# Patient Record
Sex: Female | Born: 1965 | Race: White | Hispanic: No | Marital: Married | Smoking: Never smoker
Health system: Southern US, Community
[De-identification: ages and names within clinical notes are randomized; demographics above are authoritative.]

## PROBLEM LIST (undated history)

## (undated) DIAGNOSIS — E119 Type 2 diabetes mellitus without complications: Secondary | ICD-10-CM

## (undated) HISTORY — PX: ABDOMINAL HYSTERECTOMY: SHX81

## (undated) HISTORY — DX: Type 2 diabetes mellitus without complications: E11.9

---

## 2005-05-26 ENCOUNTER — Other Ambulatory Visit: Admission: RE | Admit: 2005-05-26 | Discharge: 2005-05-26 | Payer: Self-pay | Admitting: Obstetrics and Gynecology

## 2006-03-23 ENCOUNTER — Ambulatory Visit: Payer: Self-pay | Admitting: Unknown Physician Specialty

## 2006-11-04 ENCOUNTER — Ambulatory Visit: Payer: Self-pay | Admitting: Psychiatry

## 2006-11-06 ENCOUNTER — Ambulatory Visit: Payer: Self-pay | Admitting: Psychiatry

## 2006-11-09 ENCOUNTER — Ambulatory Visit: Payer: Self-pay | Admitting: Anesthesiology

## 2006-11-09 ENCOUNTER — Emergency Department: Payer: Self-pay | Admitting: Emergency Medicine

## 2007-02-01 ENCOUNTER — Ambulatory Visit: Payer: Self-pay | Admitting: Unknown Physician Specialty

## 2013-09-27 ENCOUNTER — Ambulatory Visit: Payer: Self-pay | Admitting: Specialist

## 2014-07-10 ENCOUNTER — Ambulatory Visit: Payer: Self-pay | Admitting: Nurse Practitioner

## 2014-07-13 ENCOUNTER — Ambulatory Visit: Payer: Self-pay | Admitting: Nurse Practitioner

## 2014-09-24 ENCOUNTER — Ambulatory Visit: Payer: Self-pay | Admitting: Nurse Practitioner

## 2015-01-03 NOTE — Op Note (Signed)
PATIENT NAME:  Erica Ward, Erica Ward MR#:  [id number] DATE OF BIRTH:  November 16, 1965  DATE OF PROCEDURE:  09/27/2013  PREOPERATIVE DIAGNOSIS: Bilateral carpal tunnel syndrome.   POSTOPERATIVE DIAGNOSIS: Bilateral carpal tunnel syndrome.   PROCEDURES:  1.  Right carpal tunnel release.  2.  Left carpal tunnel release.   SURGEON: Myra Rudehristopher Haruki Arnold, M.D.   ANESTHESIA: General.   COMPLICATIONS: None.   TOURNIQUET TIME: Approximately 15 minutes on each side.   PROCEDURE: After adequate induction of general anesthesia, the upper extremities are both prepped with alcohol and DuraPrep and draped in standard sterile fashion. Identical procedures are performed on each side. The extremity is wrapped out with the Esmarch bandage and pneumatic tourniquet elevated to 250 mmHg. Under loupe magnification, a standard small volar carpal tunnel incision is made and the dissection is carefully carried down to the transverse retinacular ligament. This is incised in the midportion with the knife. The distal release is performed with the small scissors. The proximal release is performed with the small scissors and the carpal tunnel scissors. On each side, there is seen to be moderate compression of the nerve directly beneath the ligament. Careful check is made both proximally and distally to ensure that complete release has been obtained. The wound is thoroughly irrigated multiple times. Skin edges are infiltrated with 0.5% plain Marcaine. The skin is closed with 5-0 nylon. A soft bulky dressing is applied. The tourniquet is released. The patient is returned to the recovery room in satisfactory condition having tolerated the procedure quite well.   ____________________________ Clare Gandyhristopher E. Shateria Paternostro, MD ces:aw D: 09/27/2013 09:34:04 ET T: 09/27/2013 09:44:34 ET JOB#: [id number]  cc: Clare Gandyhristopher E. Braylon Lemmons, MD, <Dictator> Clare GandyHRISTOPHER E Mia Milan MD ELECTRONICALLY SIGNED 10/03/2013 20:23

## 2016-04-13 ENCOUNTER — Encounter: Payer: BC Managed Care – PPO | Attending: Family Medicine | Admitting: *Deleted

## 2016-04-13 ENCOUNTER — Encounter: Payer: Self-pay | Admitting: *Deleted

## 2016-04-13 VITALS — BP 120/82 | Ht 64.0 in | Wt 197.4 lb

## 2016-04-13 DIAGNOSIS — Z6833 Body mass index (BMI) 33.0-33.9, adult: Secondary | ICD-10-CM | POA: Diagnosis not present

## 2016-04-13 DIAGNOSIS — E119 Type 2 diabetes mellitus without complications: Secondary | ICD-10-CM | POA: Diagnosis not present

## 2016-04-13 DIAGNOSIS — E1165 Type 2 diabetes mellitus with hyperglycemia: Secondary | ICD-10-CM

## 2016-04-13 DIAGNOSIS — Z713 Dietary counseling and surveillance: Secondary | ICD-10-CM | POA: Insufficient documentation

## 2016-04-13 NOTE — Progress Notes (Signed)
Diabetes Self-Management Education  Visit Type: First/Initial  Appt. Start Time: 0840 Appt. End Time: 1000  04/13/2016  Ms. Yi Minniear, identified by name and date of birth, is a 50 y.o. female with a diagnosis of Diabetes: Type 2.   ASSESSMENT  Blood pressure 120/82, height  (1.626 m), weight 197 lb 6.4 oz (89.5 kg). Body mass index is 33.88 kg/m.      Diabetes Self-Management Education - 04/13/16 1144      Visit Information   Visit Type First/Initial     Initial Visit   Diabetes Type Type 2   Are you currently following a meal plan? Yes   What type of meal plan do you follow? "watching labels"   Are you taking your medications as prescribed? Yes   Date Diagnosed 1 month     Health Coping   How would you rate your overall health? Good     Psychosocial Assessment   Patient Belief/Attitude about Diabetes Other (comment)  "embarrased"   Self-care barriers None   Self-management support Doctor's office;Friends   Patient Concerns Nutrition/Meal planning;Glycemic Control;Monitoring;Weight Control   Special Needs None   Preferred Learning Style Auditory;Visual   Learning Readiness Contemplating   How often do you need to have someone help you when you read instructions, pamphlets, or other written materials from your doctor or pharmacy? 1 - Never   What is the last grade level you completed in school? 12th     Pre-Education Assessment   Patient understands the diabetes disease and treatment process. Needs Instruction   Patient understands incorporating nutritional management into lifestyle. Needs Instruction   Patient undertands incorporating physical activity into lifestyle. Needs Instruction   Patient understands using medications safely. Needs Instruction   Patient understands monitoring blood glucose, interpreting and using results Needs Instruction   Patient understands prevention, detection, and treatment of acute complications. Needs Instruction   Patient  understands prevention, detection, and treatment of chronic complications. Needs Instruction   Patient understands how to develop strategies to address psychosocial issues. Needs Instruction   Patient understands how to develop strategies to promote health/change behavior. Needs Instruction     Complications   Last HgB A1C per patient/outside source 11.6 %  02/18/16   How often do you check your blood sugar? 0 times/day (not testing)  Provided One Touch Verio Flex meter and instructed on use. BG upon return demonstration was 385 mg/dL at 1:61 am - 2 hrs pp.    Have you had a dilated eye exam in the past 12 months? Yes   Have you had a dental exam in the past 12 months? Yes   Are you checking your feet? No     Dietary Intake   Breakfast milk, oatmeal or poptarts   Lunch peanut butter sandwich or crackers   Snack (afternoon) almonds or dry cereal   Dinner BBQ or [employer] or chicken wtih peas, corn, green beans   Beverage(s) water, fruit juice blends, sugar sweetened tea     Patient Education   Previous Diabetes Education No   Disease state  Definition of diabetes, type 1 and 2, and the diagnosis of diabetes   Nutrition management  Role of diet in the treatment of diabetes and the relationship between the three main macronutrients and blood glucose level;Food label reading, portion sizes and measuring food.   Physical activity and exercise  Role of exercise on diabetes management, blood pressure control and cardiac health.   Medications Reviewed patients medication for diabetes, action,  purpose, timing of dose and side effects.   Monitoring Taught/evaluated SMBG meter.;Purpose and frequency of SMBG.;Identified appropriate SMBG and/or A1C goals.   Chronic complications Relationship between chronic complications and blood glucose control   Psychosocial adjustment Identified and addressed patients feelings and concerns about diabetes     Individualized Goals (developed by patient)   Reducing  Risk Improve blood sugars Prevent diabetes complications Lose weight     Outcomes   Expected Outcomes Demonstrated interest in learning. Expect positive outcomes   Future DMSE 4-6 wks      Individualized Plan for Diabetes Self-Management Training:   Learning Objective:  Patient will have a greater understanding of diabetes self-management. Patient education plan is to attend individual and/or group sessions per assessed needs and concerns.   Plan:   Patient Instructions  Check blood sugars 2 x day before breakfast and 2 hrs after supper every day Exercise: Begin walking  for 15  minutes 3  days a week and gradually increase to 150 minutes/week;  Eat 3 meals day,  1-2 snacks a day Space meals 4-6 hours apart Avoid sugar sweetened drinks (soda, tea, juices) Drink plenty of water Bring blood sugar records to the next class Call your doctor for a prescription for:  1. Meter strips (type) One Touch Verio  checking  2      times per day  2. Lancets (type) One Touch Delica checking  2  times per day   Expected Outcomes:  Demonstrated interest in learning. Expect positive outcomes  Education material provided:  General Meal Planning Guidelines Simple Meal Plan Meter - One Touch Verio Flex  If problems or questions, patient to contact team via:  Sharion Settler, RN, CCM, CDE [phone]  Future DSME appointment: 4-6 wks  Monday May 09, 2016 for Class 1

## 2016-04-13 NOTE — Patient Instructions (Signed)
Check blood sugars 2 x day before breakfast and 2 hrs after supper every day  Exercise: Begin walking  for 15  minutes 3  days a week and gradually increase to 150 minutes/week;   Eat 3 meals day,  1-2 snacks a day Space meals 4-6 hours apart Avoid sugar sweetened drinks (soda, tea, juices) Drinks plenty of water  Bring blood sugar records to the next class  Call your doctor for a prescription for:  1. Meter strips (type) One Touch Verio  checking  2      times per day  2. Lancets (type) One Touch Delica checking  2  times per day  Return for classes on:

## 2016-04-15 ENCOUNTER — Telehealth: Payer: Self-pay | Admitting: *Deleted

## 2016-04-15 NOTE — Telephone Encounter (Signed)
Called patient at 1:30 pm and left voice mail to call back with BG readings. She left me a voice mail with her numbers. Faxed those to Dr. Maryjane Hurter.

## 2016-04-21 ENCOUNTER — Telehealth: Payer: Self-pay | Admitting: *Deleted

## 2016-04-21 NOTE — Telephone Encounter (Signed)
Phone call to follow up with patient regarding blood sugars. [place] out [place] and is returning home today. She reports that MD office didn't change medication but called to schedule an appointment for tomorrow. Patient reports FBG's still 200's mg/dL and post meal readings 300's-400's mg/dL.

## 2016-05-09 ENCOUNTER — Encounter: Payer: Self-pay | Admitting: Dietician

## 2016-05-09 ENCOUNTER — Encounter: Payer: BC Managed Care – PPO | Admitting: Dietician

## 2016-05-09 VITALS — Ht 64.0 in | Wt 198.7 lb

## 2016-05-09 DIAGNOSIS — E1165 Type 2 diabetes mellitus with hyperglycemia: Secondary | ICD-10-CM

## 2016-05-09 DIAGNOSIS — Z713 Dietary counseling and surveillance: Secondary | ICD-10-CM | POA: Diagnosis not present

## 2016-05-09 NOTE — Progress Notes (Signed)

## 2016-05-23 ENCOUNTER — Encounter: Payer: Self-pay | Admitting: Dietician

## 2016-05-23 ENCOUNTER — Encounter: Payer: BC Managed Care – PPO | Attending: Family Medicine | Admitting: Dietician

## 2016-05-23 VITALS — Ht 64.0 in | Wt 199.3 lb

## 2016-05-23 DIAGNOSIS — Z6833 Body mass index (BMI) 33.0-33.9, adult: Secondary | ICD-10-CM | POA: Diagnosis not present

## 2016-05-23 DIAGNOSIS — Z713 Dietary counseling and surveillance: Secondary | ICD-10-CM | POA: Insufficient documentation

## 2016-05-23 DIAGNOSIS — E1165 Type 2 diabetes mellitus with hyperglycemia: Secondary | ICD-10-CM

## 2016-05-23 DIAGNOSIS — E119 Type 2 diabetes mellitus without complications: Secondary | ICD-10-CM | POA: Insufficient documentation

## 2016-05-23 NOTE — Progress Notes (Signed)

## 2016-05-30 ENCOUNTER — Encounter: Payer: BC Managed Care – PPO | Admitting: Dietician

## 2016-05-30 ENCOUNTER — Encounter: Payer: Self-pay | Admitting: Dietician

## 2016-05-30 VITALS — BP 116/70 | Ht 64.0 in | Wt 201.9 lb

## 2016-05-30 DIAGNOSIS — Z713 Dietary counseling and surveillance: Secondary | ICD-10-CM | POA: Diagnosis not present

## 2016-05-30 DIAGNOSIS — E1165 Type 2 diabetes mellitus with hyperglycemia: Secondary | ICD-10-CM

## 2016-05-30 NOTE — Progress Notes (Signed)

## 2016-05-31 ENCOUNTER — Encounter: Payer: Self-pay | Admitting: *Deleted

## 2019-11-29 ENCOUNTER — Other Ambulatory Visit: Payer: Self-pay | Admitting: Gerontology

## 2019-11-29 DIAGNOSIS — Z7189 Other specified counseling: Secondary | ICD-10-CM

## 2019-11-29 DIAGNOSIS — Z1231 Encounter for screening mammogram for malignant neoplasm of breast: Secondary | ICD-10-CM

## 2019-11-29 DIAGNOSIS — Z79899 Other long term (current) drug therapy: Secondary | ICD-10-CM

## 2019-11-29 DIAGNOSIS — Z7689 Persons encountering health services in other specified circumstances: Secondary | ICD-10-CM

## 2019-11-29 DIAGNOSIS — Z0000 Encounter for general adult medical examination without abnormal findings: Secondary | ICD-10-CM

## 2020-10-13 ENCOUNTER — Other Ambulatory Visit: Payer: Self-pay | Admitting: Neurology

## 2020-10-13 DIAGNOSIS — I7771 Dissection of carotid artery: Secondary | ICD-10-CM

## 2020-10-21 ENCOUNTER — Other Ambulatory Visit: Payer: Self-pay

## 2020-10-21 ENCOUNTER — Ambulatory Visit
Admission: RE | Admit: 2020-10-21 | Discharge: 2020-10-21 | Disposition: A | Discharge status: Home / Self Care | Payer: BC Managed Care – PPO | Source: Ambulatory Visit | Attending: Neurology | Admitting: Neurology

## 2020-10-21 ENCOUNTER — Other Ambulatory Visit: Payer: Self-pay | Admitting: Neurology

## 2020-10-21 DIAGNOSIS — I7771 Dissection of carotid artery: Secondary | ICD-10-CM | POA: Insufficient documentation

## 2021-06-16 IMAGING — MR MR MRA HEAD W/O CM
1 of 2 series · 18 of 48 positions shown · non-contrast
Comparison: None.

CLINICAL DATA: Initial evaluation for shooting neck pain to head
for 3 months, dizziness with looking up.

EXAM:
MRA NECK WITHOUT CONTRAST
MRA HEAD WITHOUT CONTRAST
TECHNIQUE: Multiplanar and multiecho pulse sequences of the neck were obtained
without intravenous contrast. Angiographic images of the neck were
obtained using MRA technique without and with intravenous contrast;
Angiographic images of the Circle of Willis were obtained using MRA
technique without intravenous contrast.

[Series 5: TOF · axial · 0.5mm · 0.41mm/px · z∈[-121,-24]mm · 18 of 205 slices shown]
[im 1/205]
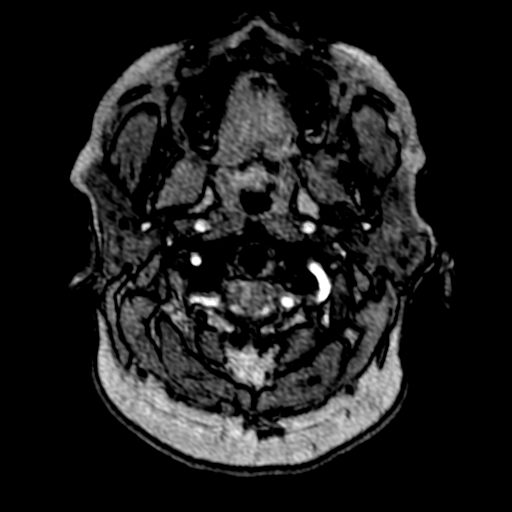
[im 8/205]
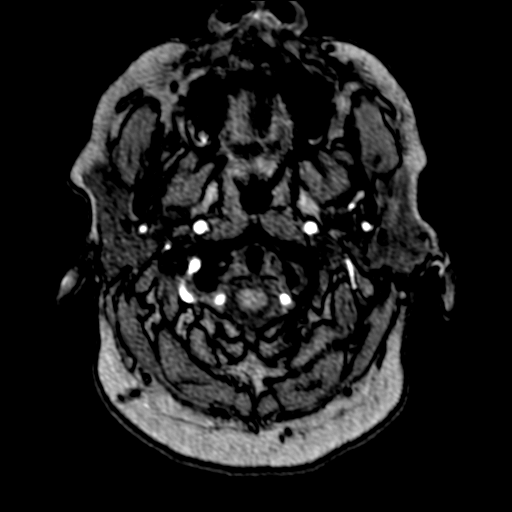
[im 15/205]
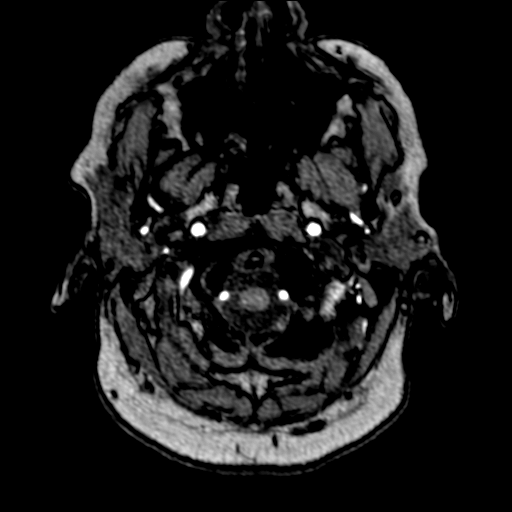
[im 22/205]
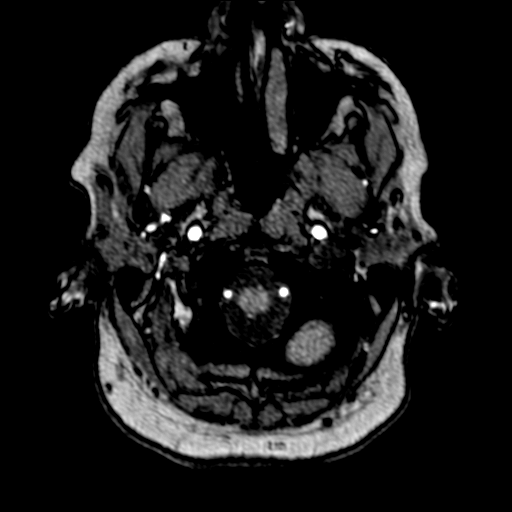
[im 30/205]
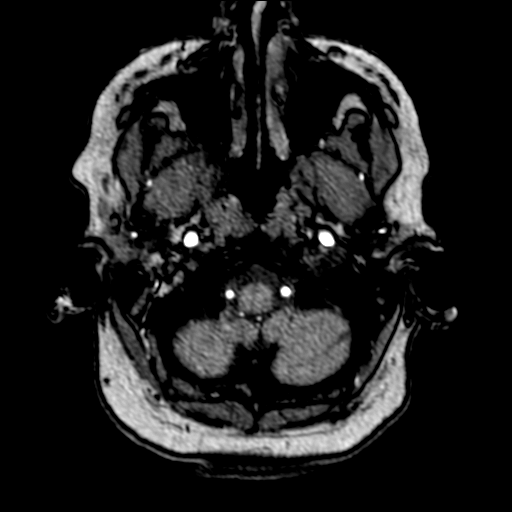
[im 37/205]
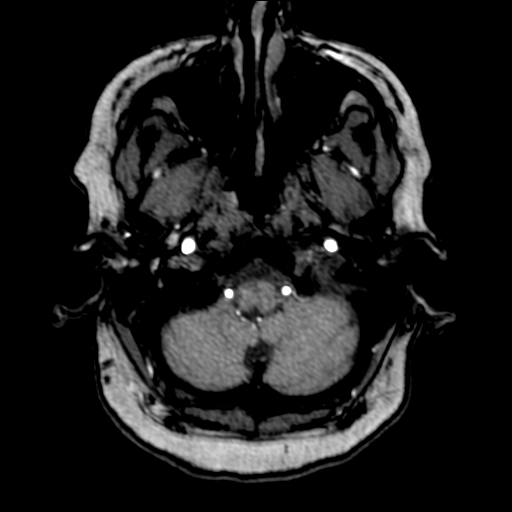
[im 44/205]
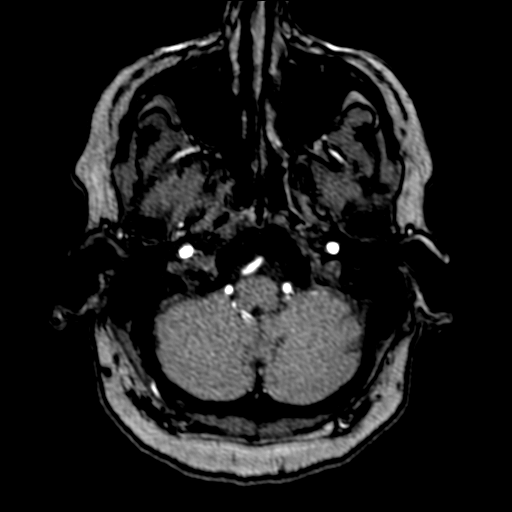
[im 52/205]
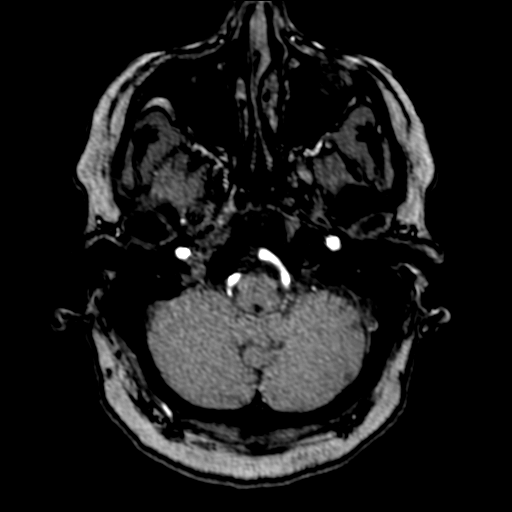
[im 59/205]
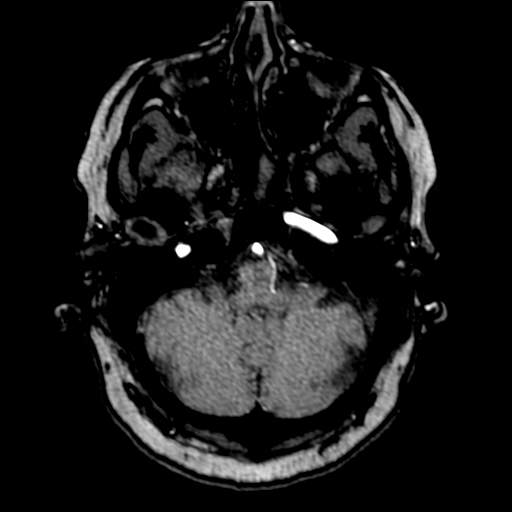
[im 66/205]
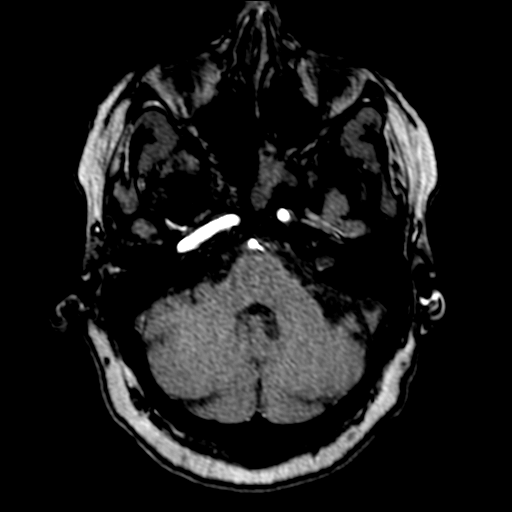
[im 73/205]
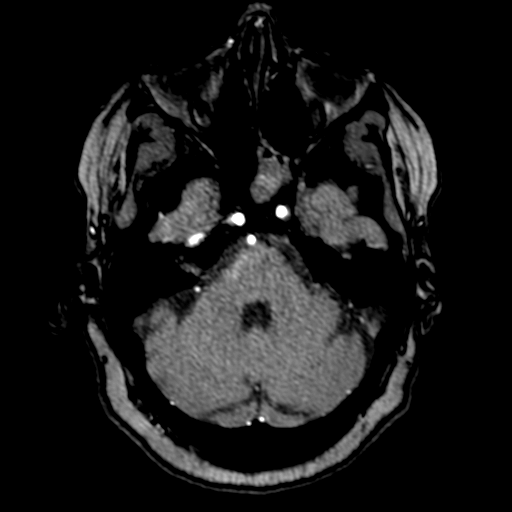
[im 88/205]
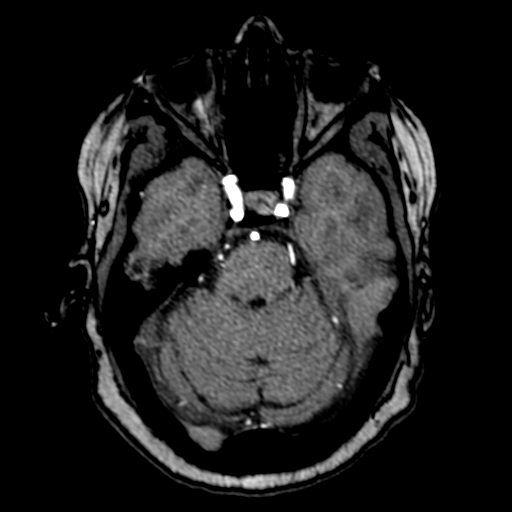
[im 103/205]
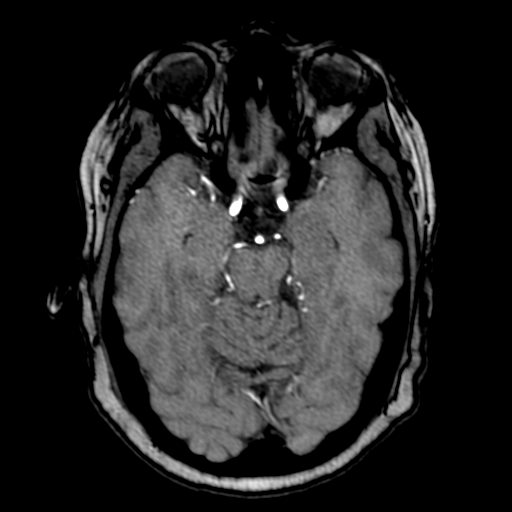
[im 117/205]
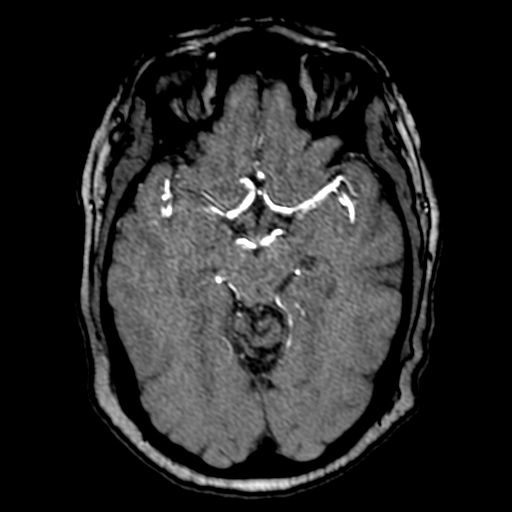
[im 139/205]
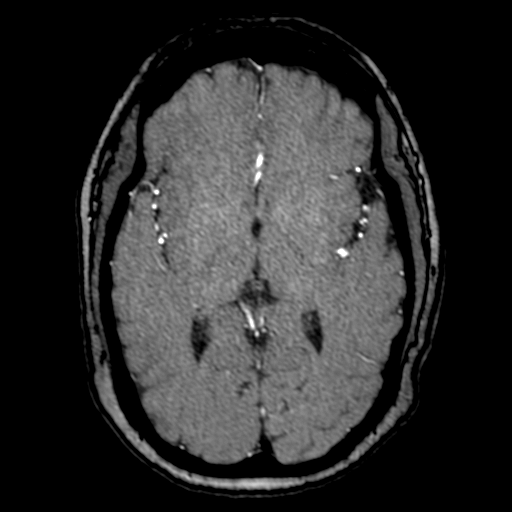
[im 168/205]
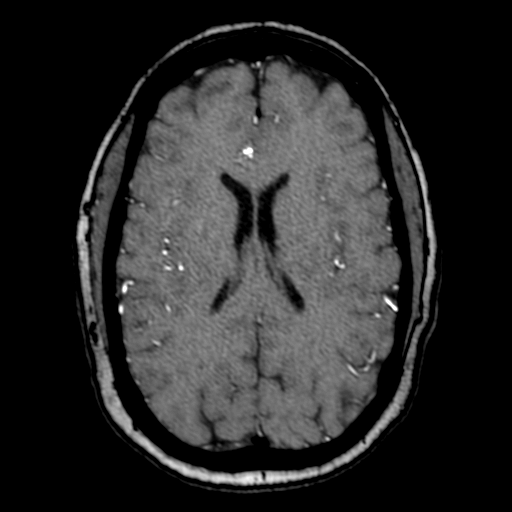
[im 175/205]
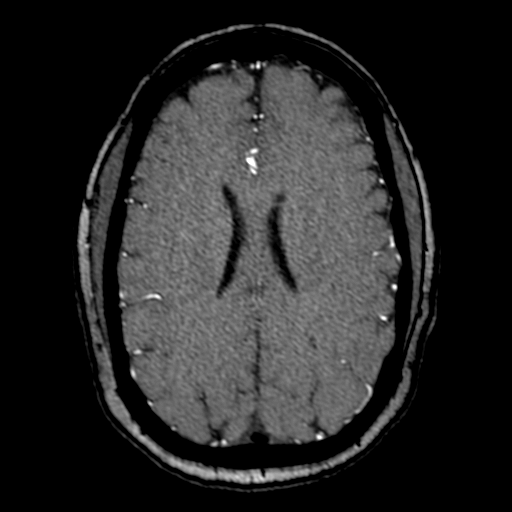
[im 197/205]
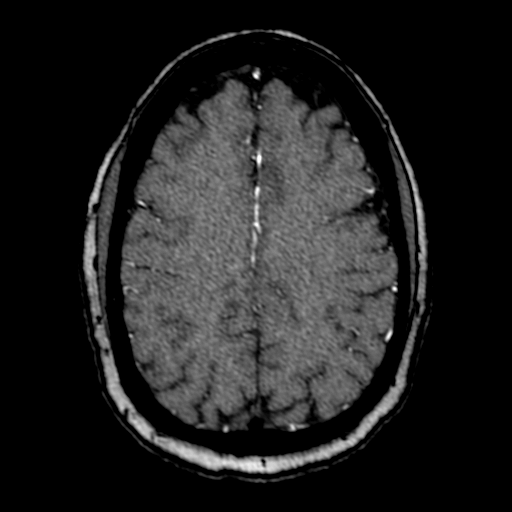

[18 of 48 positions shown; findings below may reference images not displayed]

FINDINGS: MRA NECK FINDINGS

AORTIC ARCH: Aortic arch and origin of the great vessels not
assessed on this exam.

RIGHT CAROTID SYSTEM: Visualized right CCA widely patent to the
bifurcation. Mild atheromatous irregularity about the origin of the
right ICA with no more than mild 25% stenosis by NASCET criteria.
Right ICA otherwise patent distally without stenosis, evidence for
dissection, or occlusion.

LEFT CAROTID SYSTEM: Visualized left CCA widely patent to the
bifurcation. Mild atheromatous irregularity and narrowing about the
origin of the left ICA with no more than mild 30% stenosis by NASCET
criteria. Left ICA otherwise patent distally without stenosis,
evidence for dissection, or occlusion.

VERTEBRAL ARTERIES: Visualized portions of the vertebral arteries
are widely patent with antegrade flow. No evidence for dissection,
stenosis, or occlusion.

MRA HEAD FINDINGS

ANTERIOR CIRCULATION:

Visualized distal cervical segments of the internal carotid arteries
are widely patent with antegrade flow. Petrous, cavernous, and
supraclinoid segments widely patent without stenosis or other
abnormality. Origins of the ophthalmic arteries patent and normal.
ICA termini well perfused. A1 segments patent bilaterally. Normal
anterior communicating artery complex. Anterior cerebral arteries
widely patent to their distal aspects without stenosis. No M1
stenosis or occlusion. Normal MCA bifurcations. Distal MCA branches
well perfused and symmetric.

POSTERIOR CIRCULATION:

Both vertebral arteries widely patent to the vertebrobasilar
junction without stenosis. Both PICA origins patent and normal.
Basilar widely patent to its distal aspect without stenosis.
Superior cerebellar arteries patent bilaterally. Both PCAs primarily
supplied via the basilar and are well perfused to their distal
aspects.

No aneurysm or other vascular abnormality.
IMPRESSION: MRA NECK IMPRESSION:

1. No evidence for dissection or other acute vascular abnormality.
2. Mild atheromatous narrowing of no more than 20-30% by NASCET
criteria about the origins of both internal carotid arteries.
Otherwise wide patency of both carotid artery systems within the
neck.
3. Wide patency of both vertebral arteries within the neck.

MRA HEAD IMPRESSION:

Normal intracranial MRA. No large vessel occlusion, hemodynamically
significant stenosis, or other acute vascular abnormality. No
aneurysm.

## 2022-12-27 ENCOUNTER — Other Ambulatory Visit: Payer: Self-pay | Admitting: Gerontology

## 2022-12-27 DIAGNOSIS — Z1231 Encounter for screening mammogram for malignant neoplasm of breast: Secondary | ICD-10-CM

## 2023-01-18 ENCOUNTER — Ambulatory Visit
Admission: RE | Admit: 2023-01-18 | Discharge: 2023-01-18 | Disposition: A | Discharge status: Home / Self Care | Payer: BC Managed Care – PPO | Source: Ambulatory Visit | Attending: Gerontology | Admitting: Gerontology

## 2023-01-18 DIAGNOSIS — Z1231 Encounter for screening mammogram for malignant neoplasm of breast: Secondary | ICD-10-CM | POA: Insufficient documentation

## 2023-01-19 ENCOUNTER — Other Ambulatory Visit: Payer: Self-pay | Admitting: *Deleted

## 2023-01-19 ENCOUNTER — Inpatient Hospital Stay
Admission: RE | Admit: 2023-01-19 | Discharge: 2023-01-19 | Disposition: A | Discharge status: Home / Self Care | Payer: Self-pay | Source: Ambulatory Visit | Attending: Gerontology | Admitting: Gerontology

## 2023-01-19 DIAGNOSIS — Z1231 Encounter for screening mammogram for malignant neoplasm of breast: Secondary | ICD-10-CM

## 2023-12-28 ENCOUNTER — Other Ambulatory Visit: Payer: Self-pay | Admitting: Gerontology

## 2023-12-28 DIAGNOSIS — Z1231 Encounter for screening mammogram for malignant neoplasm of breast: Secondary | ICD-10-CM

## 2024-09-20 ENCOUNTER — Other Ambulatory Visit: Payer: Self-pay | Admitting: Gerontology

## 2024-09-20 DIAGNOSIS — Z1231 Encounter for screening mammogram for malignant neoplasm of breast: Secondary | ICD-10-CM

## 2024-10-11 ENCOUNTER — Ambulatory Visit
Admission: RE | Admit: 2024-10-11 | Discharge: 2024-10-11 | Disposition: A | Payer: Self-pay | Source: Ambulatory Visit | Attending: Gerontology

## 2024-10-11 DIAGNOSIS — Z1231 Encounter for screening mammogram for malignant neoplasm of breast: Secondary | ICD-10-CM | POA: Diagnosis present
